# Patient Record
Sex: Female | Born: 1985 | Race: Black or African American | Hispanic: No | Marital: Single | Smoking: Current every day smoker
Health system: Southern US, Community
[De-identification: ages and names within clinical notes are randomized; demographics above are authoritative.]

---

## 2012-11-12 ENCOUNTER — Encounter (HOSPITAL_COMMUNITY): Payer: Self-pay | Admitting: Emergency Medicine

## 2012-11-12 ENCOUNTER — Emergency Department (HOSPITAL_COMMUNITY)
Admission: EM | Admit: 2012-11-12 | Discharge: 2012-11-12 | Disposition: A | Discharge status: Home / Self Care | Payer: Self-pay | Attending: Emergency Medicine | Admitting: Emergency Medicine

## 2012-11-12 DIAGNOSIS — N898 Other specified noninflammatory disorders of vagina: Secondary | ICD-10-CM | POA: Insufficient documentation

## 2012-11-12 DIAGNOSIS — F17200 Nicotine dependence, unspecified, uncomplicated: Secondary | ICD-10-CM | POA: Insufficient documentation

## 2012-11-12 DIAGNOSIS — Z3202 Encounter for pregnancy test, result negative: Secondary | ICD-10-CM | POA: Insufficient documentation

## 2012-11-12 DIAGNOSIS — Z202 Contact with and (suspected) exposure to infections with a predominantly sexual mode of transmission: Secondary | ICD-10-CM

## 2012-11-12 DIAGNOSIS — Z20828 Contact with and (suspected) exposure to other viral communicable diseases: Secondary | ICD-10-CM | POA: Insufficient documentation

## 2012-11-12 LAB — URINE MICROSCOPIC-ADD ON

## 2012-11-12 LAB — URINALYSIS, ROUTINE W REFLEX MICROSCOPIC
Bilirubin Urine: NEGATIVE
Glucose, UA: NEGATIVE mg/dL
Hgb urine dipstick: NEGATIVE
Ketones, ur: NEGATIVE mg/dL
Nitrite: NEGATIVE
Protein, ur: NEGATIVE mg/dL
Specific Gravity, Urine: 1.019 (ref 1.005–1.030)
pH: 6.5 (ref 5.0–8.0)

## 2012-11-12 LAB — WET PREP, GENITAL
Clue Cells Wet Prep HPF POC: NONE SEEN
Trich, Wet Prep: NONE SEEN
Yeast Wet Prep HPF POC: NONE SEEN

## 2012-11-12 MED ORDER — FLUCONAZOLE 100 MG PO TABS
100.0000 mg | ORAL_TABLET | Freq: Once | ORAL | Status: AC
  Administered 2012-11-12: 100 mg via ORAL
  Filled 2012-11-12: qty 1

## 2012-11-12 MED ORDER — CEFTRIAXONE SODIUM 250 MG IJ SOLR
250.0000 mg | Freq: Once | INTRAMUSCULAR | Status: AC
  Administered 2012-11-12: 250 mg via INTRAMUSCULAR
  Filled 2012-11-12: qty 250

## 2012-11-12 MED ORDER — AZITHROMYCIN 250 MG PO TABS
1000.0000 mg | ORAL_TABLET | Freq: Once | ORAL | Status: AC
  Administered 2012-11-12: 1000 mg via ORAL
  Filled 2012-11-12: qty 4

## 2012-11-12 NOTE — ED Notes (Signed)
Pt reports pelvic pain, vaginal discharge with foul smell.

## 2012-11-12 NOTE — ED Provider Notes (Signed)
CSN: [id number]     Arrival date & time 11/12/12  1147 History   First MD Initiated Contact with Patient 11/12/12 1153     Chief Complaint  Patient presents with  . Pelvic Pain   (Consider location/radiation/quality/duration/timing/severity/associated sxs/prior Treatment) HPI Comments: Patient presents emergency department with chief complaint of vaginal discharge and pelvic pain. She states the symptoms have been present for the past 2 days. Her sexual partner was recently seen and treated for STD. Patient denies fevers, chills, nausea, vomiting, or diarrhea. Its that her last menstrual period was about a month ago. She states that she is due to be coming onto her period now. Since the pain is mild. She also endorses some associated dysuria.   The history is provided by the patient. No language interpreter was used.    History reviewed. No pertinent past medical history. History reviewed. No pertinent past surgical history. No family history on file. History  Substance Use Topics  . Smoking status: Current Every Day Smoker    Types: Cigarettes  . Smokeless tobacco: Not on file  . Alcohol Use: Yes   OB History   Grav Para Term Preterm Abortions TAB SAB Ect Mult Living                 Review of Systems  All other systems reviewed and are negative.    Allergies  Aspirin  Home Medications  No current outpatient prescriptions on file. BP 149/98  Pulse 71  Temp(Src) 97.4 F (36.3 C) (Oral)  Resp 16  Wt 204 lb (92.534 kg)  SpO2 100%  LMP 10/15/2012 Physical Exam  Nursing note and vitals reviewed. Constitutional: She is oriented to person, place, and time. She appears well-developed and well-nourished.  HENT:  Head: Normocephalic and atraumatic.  Eyes: Conjunctivae and EOM are normal. Pupils are equal, round, and reactive to light.  Neck: Normal range of motion. Neck supple.  Cardiovascular: Normal rate and regular rhythm.  Exam reveals no gallop and no friction rub.    No murmur heard. Pulmonary/Chest: Effort normal and breath sounds normal. No respiratory distress. She has no wheezes. She has no rales. She exhibits no tenderness.  Abdominal: Soft. She exhibits no distension and no mass. There is tenderness. There is no rebound and no guarding. Hernia confirmed negative in the right inguinal area and confirmed negative in the left inguinal area.  Suprapubic tenderness, no other focal tenderness  Genitourinary: Bleeding: chaperone present during exam. No labial fusion. There is no rash, tenderness, lesion or injury on the right labia. There is no rash, tenderness, lesion or injury on the left labia. Uterus is tender. Uterus is not deviated, not enlarged and not fixed. Cervix exhibits discharge. Cervix exhibits no motion tenderness and no friability. Right adnexum displays no mass, no tenderness and no fullness. Left adnexum displays no mass, no tenderness and no fullness. No erythema, tenderness or bleeding around the vagina. No foreign body around the vagina. No signs of injury around the vagina. Vaginal discharge found.  Chaperone present for exam   Musculoskeletal: Normal range of motion. She exhibits no edema and no tenderness.  Lymphadenopathy:       Right: No inguinal adenopathy present.       Left: No inguinal adenopathy present.  Neurological: She is alert and oriented to person, place, and time.  Skin: Skin is warm and dry.  Psychiatric: She has a normal mood and affect. Her behavior is normal. Judgment and thought content normal.    ED  Course  Procedures (including critical care time) Results for orders placed during the hospital encounter of 11/12/12  WET PREP, GENITAL      Result Value Range   Yeast Wet Prep HPF POC NONE SEEN  NONE SEEN   Trich, Wet Prep NONE SEEN  NONE SEEN   Clue Cells Wet Prep HPF POC NONE SEEN  NONE SEEN   WBC, Wet Prep HPF POC NONE SEEN  NONE SEEN  URINALYSIS, ROUTINE W REFLEX MICROSCOPIC      Result Value Range    Color, Urine YELLOW  YELLOW   APPearance CLEAR  CLEAR   Specific Gravity, Urine 1.019  1.005 - 1.030   pH 6.5  5.0 - 8.0   Glucose, UA NEGATIVE  NEGATIVE mg/dL   Hgb urine dipstick NEGATIVE  NEGATIVE   Bilirubin Urine NEGATIVE  NEGATIVE   Ketones, ur NEGATIVE  NEGATIVE mg/dL   Protein, ur NEGATIVE  NEGATIVE mg/dL   Urobilinogen, UA 0.2  0.0 - 1.0 mg/dL   Nitrite NEGATIVE  NEGATIVE   Leukocytes, UA SMALL (*) NEGATIVE  URINE MICROSCOPIC-ADD ON      Result Value Range   Squamous Epithelial / LPF FEW (*) RARE   WBC, UA 3-6  <3 WBC/hpf  POCT PREGNANCY, URINE      Result Value Range   Preg Test, Ur NEGATIVE  NEGATIVE   No results found.   EKG Interpretation   None       MDM   1. Exposure to STD     Patient STD exposure. Reports vaginal discharge, and some suprapubic tenderness. Also complains dysuria. Will check urinalysis, and pelvic exam.  The patient's sexual partner was treated yesterday for penile discharge. I will treat the patient with azithromycin and Rocephin. Doubt PID, no cervical motion tenderness, patient is afebrile, and well-appearing. Patient discussed with Dr. Denton Lank who agrees with the plan. Discharged to home.    Roxy Horseman, PA-C 11/12/12 1447

## 2012-11-14 LAB — GC/CHLAMYDIA PROBE AMP
CT Probe RNA: NEGATIVE
GC Probe RNA: POSITIVE — AB

## 2012-11-15 NOTE — ED Notes (Signed)
+  gonorrhea Patient treated with Rocephin And Zithromax-[facility] faxed 

## 2012-11-16 NOTE — ED Provider Notes (Signed)
Medical screening examination/treatment/procedure(s) were conducted as a shared visit with non-physician practitioner(s) and myself.  I personally evaluated the patient during the encounter.  EKG Interpretation   None       Pt notes vaginal d/c. ?possible exposure to std. No fever. abd soft nt.   Suzi Roots, MD 11/16/12 1524

## 2012-11-19 ENCOUNTER — Telehealth (HOSPITAL_COMMUNITY): Payer: Self-pay | Admitting: *Deleted

## 2012-11-19 NOTE — ED Notes (Signed)
Patient informed of positive results and requests that rx be called to [facility] on [address]. rx called to pharmacy by [facility].

## 2013-04-01 ENCOUNTER — Encounter (HOSPITAL_COMMUNITY): Payer: Self-pay | Admitting: Emergency Medicine

## 2013-04-01 ENCOUNTER — Emergency Department (HOSPITAL_COMMUNITY)
Admission: EM | Admit: 2013-04-01 | Discharge: 2013-04-01 | Disposition: A | Discharge status: Home / Self Care | Payer: Self-pay | Attending: Emergency Medicine | Admitting: Emergency Medicine

## 2013-04-01 DIAGNOSIS — F17200 Nicotine dependence, unspecified, uncomplicated: Secondary | ICD-10-CM | POA: Insufficient documentation

## 2013-04-01 DIAGNOSIS — N390 Urinary tract infection, site not specified: Secondary | ICD-10-CM | POA: Insufficient documentation

## 2013-04-01 DIAGNOSIS — Z3202 Encounter for pregnancy test, result negative: Secondary | ICD-10-CM | POA: Insufficient documentation

## 2013-04-01 LAB — URINALYSIS, ROUTINE W REFLEX MICROSCOPIC
Bilirubin Urine: NEGATIVE
GLUCOSE, UA: NEGATIVE mg/dL
HGB URINE DIPSTICK: NEGATIVE
KETONES UR: NEGATIVE mg/dL
Nitrite: POSITIVE — AB
PROTEIN: NEGATIVE mg/dL
Specific Gravity, Urine: 1.017 (ref 1.005–1.030)
Urobilinogen, UA: 0.2 mg/dL (ref 0.0–1.0)
pH: 6 (ref 5.0–8.0)

## 2013-04-01 LAB — PREGNANCY, URINE: Preg Test, Ur: NEGATIVE

## 2013-04-01 LAB — HIV ANTIBODY (ROUTINE TESTING W REFLEX): HIV: NONREACTIVE

## 2013-04-01 LAB — WET PREP, GENITAL
CLUE CELLS WET PREP: NONE SEEN
Trich, Wet Prep: NONE SEEN
Yeast Wet Prep HPF POC: NONE SEEN

## 2013-04-01 LAB — URINE MICROSCOPIC-ADD ON

## 2013-04-01 MED ORDER — SULFAMETHOXAZOLE-TRIMETHOPRIM 800-160 MG PO TABS: 1.0000 | ORAL_TABLET | Freq: Two times a day (BID) | ORAL | Status: AC

## 2013-04-01 NOTE — ED Notes (Signed)
Pt presents to department for evaluation of lower abdominal pain and nausea. Ongoing x1 week. 7/10 pain at the time. Also states urinary frequency. Pt is alert and oriented x4.

## 2013-04-01 NOTE — ED Provider Notes (Addendum)
CSN: [id number]     Arrival date & time 04/01/13  4098 History   First MD Initiated Contact with Patient 04/01/13 1005     Chief Complaint  Patient presents with  . Abdominal Pain  . Nausea    Consider location/radiation/quality/duration/timing/severity/associated sxs/prior Treatment) Patient is a 28 y.o. female presenting with abdominal pain. The history is provided by the patient. No language interpreter was used.  Abdominal Pain Pain location:  Suprapubic Pain quality: aching and pressure   Pain radiates to:  Does not radiate Duration:  1 week Associated symptoms: nausea   Associated symptoms: no chills, no diarrhea, no dysuria, no fatigue, no fever, no hematuria, no shortness of breath, no vaginal bleeding, no vaginal discharge and no vomiting    patient is a 28 year old female who presents with lower abdominal pressure, abdominal pain and nausea for one week. She denies dysuria or hematuria but reports frequency. She denies fever, chills, vomiting or diarrhea. No recent illness or sick exposure. She denies back pain. She reports that she has had some recent constipation. She says that she did a pregnancy test at home a couple of days ago and it was negative that she wishes to be retested. She reports that her LMP was 02/26/2013. She reports that she has a period every month, and it comes every 28-30 days. No new sex partners, vaginal discharge or vaginal bleeding. She is not currently using any form of birth control.  History reviewed. No pertinent past medical history. History reviewed. No pertinent past surgical history. No family history on file. History  Substance Use Topics  . Smoking status: Current Every Day Smoker    Types: Cigarettes  . Smokeless tobacco: Not on file  . Alcohol Use: Yes   OB History   Grav Para Term Preterm Abortions TAB SAB Ect Mult Living                 Review of Systems  Constitutional: Negative for fever, chills and fatigue.  Respiratory:  Negative for shortness of breath.   Gastrointestinal: Positive for nausea. Negative for vomiting, abdominal pain and diarrhea.  Genitourinary: Positive for frequency and pelvic pain. Negative for dysuria, hematuria, vaginal bleeding and vaginal discharge.  Musculoskeletal: Negative for back pain, gait problem, neck pain and neck stiffness.  Neurological: Negative for dizziness and weakness.  All other systems reviewed and are negative.      Allergies  Aspirin  Home Medications  No current outpatient prescriptions on file. BP 139/77  Pulse 70  Temp(Src) 98 F (36.7 C) (Oral)  Resp 18  SpO2 100% Physical Exam  Nursing note and vitals reviewed. Constitutional: She is oriented to person, place, and time. She appears well-developed and well-nourished. No distress.  Well appearing  HENT:  Head: Normocephalic and atraumatic.  Eyes: Conjunctivae and EOM are normal.  Neck: Normal range of motion. Neck supple. No JVD present. No tracheal deviation present. No thyromegaly present.  Cardiovascular: Normal rate, regular rhythm and normal heart sounds.   Pulmonary/Chest: Effort normal and breath sounds normal.  Abdominal: Soft. Bowel sounds are normal. There is tenderness in the suprapubic area. There is no rigidity, no rebound, no guarding and no tenderness at McBurney's point.  Genitourinary: Uterus normal. There is no rash, tenderness, lesion or injury on the right labia. There is no rash, tenderness, lesion or injury on the left labia. Cervix exhibits discharge. Cervix exhibits no motion tenderness and no friability. Right adnexum displays no tenderness and no fullness. Left adnexum displays no  tenderness and no fullness. No signs of injury around the vagina. Vaginal discharge found.  Thick whitish discharge  Musculoskeletal: Normal range of motion.  Lymphadenopathy:    She has no cervical adenopathy.  Neurological: She is alert and oriented to person, place, and time.  Skin: Skin is  warm and dry. No rash noted.  Psychiatric: She has a normal mood and affect. Her behavior is normal. Judgment and thought content normal.    ED Course  Procedures (including critical care time) Labs Review Labs Reviewed - No data to display Imaging Review No results found.   EKG Interpretation None      MDM   Final diagnoses:  UTI (lower urinary tract infection)   Lower abdominal pressure, over the area of her bladder. No CMT or adnexal fullness on exam. Wet prep negative. Urine preg, negative. Urinalysis; +UTI, moderate leukocytes and nitrite positive. Plan discussed with pt and she agrees. Will call if culture results are positive. Prescription for Bactrim DS given. Return precautions discussed.      Irish EldersKelly Darnell Jeschke, NP 04/01/13 1528  Irish EldersKelly Faigy Stretch, NP 04/03/13 1235

## 2013-04-01 NOTE — Discharge Instructions (Signed)
Urinary Tract Infection Urinary tract infections (UTIs) can develop anywhere along your urinary tract. Your urinary tract is your body's drainage system for removing wastes and extra water. Your urinary tract includes two kidneys, two ureters, a bladder, and a urethra. Your kidneys are a pair of bean-shaped organs. Each kidney is about the size of your fist. They are located below your ribs, one on each side of your spine. CAUSES Infections are caused by microbes, which are microscopic organisms, including fungi, viruses, and bacteria. These organisms are so small that they can only be seen through a microscope. Bacteria are the microbes that most commonly cause UTIs. SYMPTOMS  Symptoms of UTIs may vary by age and gender of the patient and by the location of the infection. Symptoms in young women typically include a frequent and intense urge to urinate and a painful, burning feeling in the bladder or urethra during urination. Older women and men are more likely to be tired, shaky, and weak and have muscle aches and abdominal pain. A fever may mean the infection is in your kidneys. Other symptoms of a kidney infection include pain in your back or sides below the ribs, nausea, and vomiting. DIAGNOSIS To diagnose a UTI, your caregiver will ask you about your symptoms. Your caregiver also will ask to provide a urine sample. The urine sample will be tested for bacteria and white blood cells. White blood cells are made by your body to help fight infection. TREATMENT  Typically, UTIs can be treated with medication. Because most UTIs are caused by a bacterial infection, they usually can be treated with the use of antibiotics. The choice of antibiotic and length of treatment depend on your symptoms and the type of bacteria causing your infection. HOME CARE INSTRUCTIONS  If you were prescribed antibiotics, take them exactly as your caregiver instructs you. Finish the medication even if you feel better after you  have only taken some of the medication.  Drink enough water and fluids to keep your urine clear or pale yellow.  Avoid caffeine, tea, and carbonated beverages. They tend to irritate your bladder.  Empty your bladder often. Avoid holding urine for long periods of time.  Empty your bladder before and after sexual intercourse.  After a bowel movement, women should cleanse from front to back. Use each tissue only once. SEEK MEDICAL CARE IF:   You have back pain.  You develop a fever.  Your symptoms do not begin to resolve within 3 days. SEEK IMMEDIATE MEDICAL CARE IF:   You have severe back pain or lower abdominal pain.  You develop chills.  You have nausea or vomiting.  You have continued burning or discomfort with urination. MAKE SURE YOU:   Understand these instructions.  Will watch your condition.  Will get help right away if you are not doing well or get worse. Document Released: 10/07/2004 Document Revised: 06/29/2011 Document Reviewed: 02/05/2011 [facility] Patient Information 2014 [employer], [place].   Drink a lot of oral fluids Take antibiotic as directed Urinate before and after intercourse

## 2013-04-02 LAB — GC/CHLAMYDIA PROBE AMP
CT Probe RNA: NEGATIVE
GC PROBE AMP APTIMA: NEGATIVE

## 2013-04-02 NOTE — ED Provider Notes (Signed)
Medical screening examination/treatment/procedure(s) were performed by non-physician practitioner and as supervising physician I was immediately available for consultation/collaboration.  Hurman HornJohn M Connelly [place], MD 04/02/13 2325

## 2013-06-05 ENCOUNTER — Encounter (HOSPITAL_COMMUNITY): Payer: Self-pay | Admitting: Emergency Medicine

## 2013-06-05 ENCOUNTER — Emergency Department (HOSPITAL_COMMUNITY): Payer: No Typology Code available for payment source

## 2013-06-05 ENCOUNTER — Emergency Department (HOSPITAL_COMMUNITY)
Admission: EM | Admit: 2013-06-05 | Discharge: 2013-06-05 | Disposition: A | Discharge status: Home / Self Care | Payer: No Typology Code available for payment source | Attending: Emergency Medicine | Admitting: Emergency Medicine

## 2013-06-05 DIAGNOSIS — IMO0002 Reserved for concepts with insufficient information to code with codable children: Secondary | ICD-10-CM | POA: Insufficient documentation

## 2013-06-05 DIAGNOSIS — Y9389 Activity, other specified: Secondary | ICD-10-CM | POA: Insufficient documentation

## 2013-06-05 DIAGNOSIS — F17200 Nicotine dependence, unspecified, uncomplicated: Secondary | ICD-10-CM | POA: Insufficient documentation

## 2013-06-05 DIAGNOSIS — Y92410 Unspecified street and highway as the place of occurrence of the external cause: Secondary | ICD-10-CM | POA: Insufficient documentation

## 2013-06-05 DIAGNOSIS — M549 Dorsalgia, unspecified: Secondary | ICD-10-CM

## 2013-06-05 MED ORDER — NAPROXEN 500 MG PO TABS: 500.0000 mg | ORAL_TABLET | Freq: Two times a day (BID) | ORAL | Status: AC

## 2013-06-05 MED ORDER — OXYCODONE-ACETAMINOPHEN 5-325 MG PO TABS
1.0000 | ORAL_TABLET | Freq: Once | ORAL | Status: AC
  Administered 2013-06-05: 1 via ORAL
  Filled 2013-06-05: qty 1

## 2013-06-05 MED ORDER — CYCLOBENZAPRINE HCL 5 MG PO TABS: 5.0000 mg | ORAL_TABLET | Freq: Three times a day (TID) | ORAL | Status: AC | PRN
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
## 2013-06-05 NOTE — Discharge Instructions (Signed)
Take naprosyn for pain - twice daily with food  Take flexeril - muscle relaxer - Please be careful with this medication.  It can cause drowsiness.  Use caution while driving, operating machinery, drinking alcohol, or any other activities that may impair your physical or mental abilities.   Return to the emergency department if you develop any changing/worsening condition, loss of bowel/bladder function, weakness, loss of sensation, fever or any other concerns (please read additional information regarding your condition below)   Back Pain, Adult Low back pain is very common. About 1 in 5 people have back pain.The cause of low back pain is rarely dangerous. The pain often gets better over time.About half of people with a sudden onset of back pain feel better in just 2 weeks. About 8 in 10 people feel better by 6 weeks.  CAUSES Some common causes of back pain include:  Strain of the muscles or ligaments supporting the spine.  Wear and tear (degeneration) of the spinal discs.  Arthritis.  Direct injury to the back. DIAGNOSIS Most of the time, the direct cause of low back pain is not known.However, back pain can be treated effectively even when the exact cause of the pain is unknown.Answering your caregiver's questions about your overall health and symptoms is one of the most accurate ways to make sure the cause of your pain is not dangerous. If your caregiver needs more information, he or she may order lab work or imaging tests (X-rays or MRIs).However, even if imaging tests show changes in your back, this usually does not require surgery. HOME CARE INSTRUCTIONS For many people, back pain returns.Since low back pain is rarely dangerous, it is often a condition that people can learn to [facility] their own.   Remain active. It is stressful on the back to sit or stand in one place. Do not sit, drive, or stand in one place for more than 30 minutes at a time. Take short walks on level surfaces as  soon as pain allows.Try to increase the length of time you walk each day.  Do not stay in bed.Resting more than 1 or 2 days can delay your recovery.  Do not avoid exercise or work.Your body is made to move.It is not dangerous to be active, even though your back may hurt.Your back will likely heal faster if you return to being active before your pain is gone.  Pay attention to your body when you bend and lift. Many people have less discomfortwhen lifting if they bend their knees, keep the load close to their bodies,and avoid twisting. Often, the most comfortable positions are those that put less stress on your recovering back.  Find a comfortable position to sleep. Use a firm mattress and lie on your side with your knees slightly bent. If you lie on your back, put a pillow under your knees.  Only take over-the-counter or prescription medicines as directed by your caregiver. Over-the-counter medicines to reduce pain and inflammation are often the most helpful.Your caregiver may prescribe muscle relaxant drugs.These medicines help dull your pain so you can more quickly return to your normal activities and healthy exercise.  Put ice on the injured area.  Put ice in a plastic bag.  Place a towel between your skin and the bag.  Leave the ice on for 15-20 minutes, 03-04 times a day for the first 2 to 3 days. After that, ice and heat may be alternated to reduce pain and spasms.  Ask your caregiver about trying back  exercises and gentle massage. This may be of some benefit.  Avoid feeling anxious or stressed.Stress increases muscle tension and can worsen back pain.It is important to recognize when you are anxious or stressed and learn ways to manage it.Exercise is a great option. SEEK MEDICAL CARE IF:  You have pain that is not relieved with rest or medicine.  You have pain that does not improve in 1 week.  You have new symptoms.  You are generally not feeling well. SEEK IMMEDIATE  MEDICAL CARE IF:   You have pain that radiates from your back into your legs.  You develop new bowel or bladder control problems.  You have unusual weakness or numbness in your arms or legs.  You develop nausea or vomiting.  You develop abdominal pain.  You feel faint. Document Released: 12/28/2004 Document Revised: 06/29/2011 Document Reviewed: 05/18/2010 [facility] Patient Information 2014 [place], [place].   Emergency Department Resource Guide 1) Find a Doctor and Pay Out of Pocket Although you won't have to find out who is covered by your insurance plan, it is a good idea to ask around and get recommendations. You will then need to call the office and see if the doctor you have chosen will accept you as a new patient and what types of options they offer for patients who are self-pay. Some doctors offer discounts or will set up payment plans for their patients who do not have insurance, but you will need to ask so you aren't surprised when you get to your appointment.  2) Contact Your Local Health Department Not all health departments have doctors that can see patients for sick visits, but many do, so it is worth a call to see if yours does. If you don't know where your local health department is, you can check in your phone book. The CDC also has a tool to help you locate your state's health department, and many state websites also have listings of all of their local health departments.  3) Find a Walk-in Clinic If your illness is not likely to be very severe or complicated, you may want to try a walk in clinic. These are popping up all over the country in pharmacies, drugstores, and shopping centers. They're usually staffed by nurse practitioners or physician assistants that have been trained to treat common illnesses and complaints. They're usually fairly quick and inexpensive. However, if you have serious medical issues or chronic medical problems, these are probably not your best  option.  No Primary Care Doctor: - Call Health Connect at  [phone] - they can help you locate a primary care doctor that  accepts your insurance, provides certain services, etc. - Physician Referral Service- [phone]  Chronic Pain Problems: Organization         Address  Phone   Notes  [facility]  [phone] Patients need to be referred by their primary care doctor.   Medication Assistance: Organization         Address  Phone   Notes  [facility] [address] [place], [place] [phone] --Must be a resident of [facility] -- Must have NO insurance coverage whatsoever (no Medicaid/ Medicare, etc.) -- The pt. MUST have a primary care doctor that directs their care regularly and follows them in the community   MedAssist  [phone]   [employer]  [phone]    Agencies that provide inexpensive medical care: Organization  Address  Phone   Notes  [facility]  [phone]   [facility]    [phone]   [facility] [place], [place] [address] [phone]   [place] [address] [place] [place] [phone]   Planned Parenthood    [phone]   [facility]    [phone]   [place] and [place] [address], [place] Phone:  [phone], Fax:  [phone] Hours of Operation:  9 am - 6 pm, M-F.  Also accepts Medicaid/Medicare and self-pay.  [facility] for [place], [address], [place] Phone: [phone], Fax: [phone]. Hours of Operation:  8:30 am - 5:30 pm, M-F.  Also accepts Medicaid and self-pay.  [facility] [place] [address] [place] Phone: [phone]   [place], [place], [place] [phone], Ext. [phone] Mondays & Thursdays: 7-9 AM.  First 15  patients are seen on a first come, first serve basis.    [place] Providers:  Organization         Address  Phone   Notes  [facility] [address], [place] [phone] Also accepts self-pay patients.  [facility] [id number] [place], [place]  [phone]   [place], [address], [place] [phone]   [facility] [address], [place] [phone]   Lucianne Lei [address], [place]   [phone] Only accepts [place] Access [place] patients after they have their name applied to their card.   Self-Pay (no insurance) in [facility]:  Organization         Address  Phone   Notes  Sickle Cell Patients, [facility] [place] [phone]   [facility] [place] [phone]   [facility] [place]  [place], [place],  [phone]   [facility]/Dr. Osei-Bonsu  [address], [place] or [address], [place] [phone] Phone number for both [place] and [place] locations is the same.  Urgent Medical and [facility] [address], [place] [phone]   [facility] [address], [place] or [address] [phone]   [facility] [place] [address], [place] [phone], phone; [phone], fax Sees patients 1st and 3rd Saturday of every month.  Must not qualify for public or private insurance (i.e. Medicaid, Medicare, [place] Health Choice, Veterans' Benefits)  Household income should be no more than 200% of the poverty level The clinic cannot treat you if you are pregnant or think you are pregnant  Sexually transmitted diseases are not treated at the clinic.    Dental  Care: Organization         Address  Phone  Notes  [facility] of [facility] [phone] Accepts children up to age 35 who are enrolled in [place] or [place]; pregnant women with a Medicaid card; and children who have applied for Medicaid or  Health Choice, but were declined, whose parents can pay a reduced fee at time  of service.  [facility] Department of [facility]  [address], [place] [phone] Accepts children up to age 46 who are enrolled in [place] or [place]; pregnant women with a Medicaid card; and children who have applied for Medicaid or [place] Health Choice, but were declined, whose parents can pay a reduced fee at time of service.  [place] Adult Dental Access PROGRAM  [place] [phone] Patients are seen by appointment only. Walk-ins are not accepted. [place] will see patients 50 years of age and older. Monday - Tuesday (8am-5pm) Most Wednesdays (8:30-5pm) $30 per visit, cash only  [facility] Adult Dental Access PROGRAM  [address], [facility] [phone] Patients are seen by appointment only. Walk-ins are not accepted. [place] will see patients 64 years of age and older. One Wednesday Evening (Monthly: Volunteer Based).  $30 per visit, cash only  [place]  [phone] for adults; Children under age 18, call Graduate Pediatric Dentistry at [phone]. Children aged 70-14, please call [phone] to request a pediatric application.  Dental services are provided in all areas of dental care including fillings, crowns and bridges, complete and partial dentures, implants, gum treatment, root canals, and extractions. Preventive care is also provided. Treatment is provided to both adults and children. Patients are selected via a lottery and there is often a waiting list.   [facility] [address], [place]  [phone] [email or web address]   [facility] [address] [place], [place] [phone], Ext. [phone] Second and Fourth Thursday of each month, opens at 6:30 AM; Clinic ends at 9 AM.  Patients are seen on a first-come first-served basis, and a limited number are seen during each clinic.   [facility]  [address] Hillard Danker [place], [place] [phone]   Eligibility Requirements You must have lived in [place], [place], or [place] counties for at least the last three months.   You cannot be eligible for state or federal sponsored [employer], including [employer], [place], or [employer].   You generally cannot be eligible for healthcare insurance through your employer.    How to apply: Eligibility screenings are held every Tuesday and Wednesday afternoon from 1:00 pm until 4:00 pm. You do not need an appointment for the interview!  [facility] [address], [place], [place]   [place]  [facility] Department  [place]  [phone]    Behavioral Health Resources in the Community: Intensive Outpatient Programs Organization         Address  Phone  Notes  [place]. [address] [place], [place] [phone]   [facility] Outpatient [address], [place], [place]   ADS: Alcohol & Drug Svcs [address], [place], [place]   [place] [address]  [place], [place] or [phone]   Substance Abuse Resources Organization         Address  Phone  Notes  Alcohol and Drug Services  [phone]   [place]  [phone]   [facility] [place]   Chinita Pester  [phone]   Residential & Outpatient Substance Abuse Program  [phone]    Psychological Services Organization         Address  Phone  Notes  [employer] [facility]  [phone]   [facility] Services  [phone]   [facility] Mental [facility] [address], [place] [phone] or [phone]    Mobile Crisis Teams Organization         Address  Phone  Notes  [facility], Mobile Crisis Care Unit  [phone]   [facility]  [address] [place], [place] [phone]   Doristine Locks [address] [place] [phone]    Self-Help/Support Groups Organization         Address  Phone             Notes  [facility]ental Health Assoc. of [place] - variety of support groups  [phone]- I7437963 Call for more information  Narcotics Anonymous (NA), Caring Services [address], [employer] [place]  2 meetings at this location   Statistician         Address  Phone  Notes  [facility] [address],    [place]  [phone]   [facility]  [address], [place] [address], [place], [place] [phone]   [facility] [address] [place], [place] [phone] Admissions: 8am-3pm M-F  [facility] [address]    [place], [place] [phone]   [facility] [address] [place], [place], [place] [phone]   [facility] [address],  [place], [place] [phone]   [facility] - Intensive Outpatient [address]ure[address], [place], [place] [phone]   [facility] (Addiction Recovery Care [facility] [address] [place]  [place], [place] [phone] or [phone]   [facility] Services (RTS) [address], [place], [place] [phone] Accepts Medicaid  [facility] [place] [address],  [place] [phone] Substance Abuse/Addiction Treatment   [facility] Organization         Address  Phone  Notes  [facility]  [phone]   Angie Fava, PhD [address] Ervin Knack [place], [place]   [phone] or [phone]   [facility]   [address] [place], [place] [phone]   [facility] [address] [place], [place] [phone] Insurance/Medicaid/sponsorship through [facility] and Families [address]                                    [place], [place] [phone] Therapy/tele-psych/case  [facility] [address], [place] [phone]    Dr. Lolly Mustache  [phone]   [facility] [place]  United [employer] [facility] Dept. 1) [address] [place] 2) [address] [place] 3)  [address], [place] [phone]  [phone]   [facility] Child Abuse Hotline 639-338-9850 or 417-344-1500 (After Hours)
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
## 2013-06-07 NOTE — ED Provider Notes (Signed)
Medical screening examination/treatment/procedure(s) were performed by non-physician practitioner and as supervising physician I was immediately available for consultation/collaboration.  Deaglan Lile J. Vienne Corcoran, MD 06/07/13 1650 

## 2013-11-18 ENCOUNTER — Encounter (HOSPITAL_COMMUNITY): Payer: Self-pay | Admitting: Nurse Practitioner

## 2013-11-18 ENCOUNTER — Emergency Department (HOSPITAL_COMMUNITY)
Admission: EM | Admit: 2013-11-18 | Discharge: 2013-11-18 | Disposition: A | Discharge status: Home / Self Care | Payer: No Typology Code available for payment source | Attending: Emergency Medicine | Admitting: Emergency Medicine

## 2013-11-18 DIAGNOSIS — R300 Dysuria: Secondary | ICD-10-CM

## 2013-11-18 DIAGNOSIS — N949 Unspecified condition associated with female genital organs and menstrual cycle: Secondary | ICD-10-CM

## 2013-11-18 DIAGNOSIS — Z791 Long term (current) use of non-steroidal anti-inflammatories (NSAID): Secondary | ICD-10-CM | POA: Insufficient documentation

## 2013-11-18 DIAGNOSIS — N898 Other specified noninflammatory disorders of vagina: Secondary | ICD-10-CM | POA: Insufficient documentation

## 2013-11-18 DIAGNOSIS — Z720 Tobacco use: Secondary | ICD-10-CM | POA: Insufficient documentation

## 2013-11-18 DIAGNOSIS — Z3202 Encounter for pregnancy test, result negative: Secondary | ICD-10-CM | POA: Insufficient documentation

## 2013-11-18 LAB — URINALYSIS, ROUTINE W REFLEX MICROSCOPIC
BILIRUBIN URINE: NEGATIVE
Glucose, UA: NEGATIVE mg/dL
Ketones, ur: NEGATIVE mg/dL
NITRITE: NEGATIVE
PH: 7 (ref 5.0–8.0)
Protein, ur: NEGATIVE mg/dL
Specific Gravity, Urine: 1.012 (ref 1.005–1.030)
Urobilinogen, UA: 0.2 mg/dL (ref 0.0–1.0)

## 2013-11-18 LAB — POC URINE PREG, ED: Preg Test, Ur: NEGATIVE

## 2013-11-18 LAB — URINE MICROSCOPIC-ADD ON

## 2013-11-18 LAB — PREGNANCY, URINE: Preg Test, Ur: NEGATIVE

## 2013-11-18 LAB — WET PREP, GENITAL
Clue Cells Wet Prep HPF POC: NONE SEEN
Trich, Wet Prep: NONE SEEN
YEAST WET PREP: NONE SEEN

## 2013-11-18 MED ORDER — CEFTRIAXONE SODIUM 250 MG IJ SOLR
250.0000 mg | Freq: Once | INTRAMUSCULAR | Status: AC
  Administered 2013-11-18: 250 mg via INTRAMUSCULAR
  Filled 2013-11-18: qty 250

## 2013-11-18 MED ORDER — LIDOCAINE HCL (PF) 1 % IJ SOLN
2.0000 mL | Freq: Once | INTRAMUSCULAR | Status: AC
  Administered 2013-11-18: 2 mL

## 2013-11-18 MED ORDER — LIDOCAINE HCL (PF) 1 % IJ SOLN
INTRAMUSCULAR | Status: AC
  Filled 2013-11-18: qty 5

## 2013-11-18 MED ORDER — AZITHROMYCIN 250 MG PO TABS
1000.0000 mg | ORAL_TABLET | Freq: Once | ORAL | Status: AC
  Administered 2013-11-18: 1000 mg via ORAL
  Filled 2013-11-18: qty 4

## 2013-11-18 NOTE — ED Provider Notes (Signed)
CSN: [id number]     Arrival date & time 11/18/13  1320 History   First MD Initiated Contact with Patient 11/18/13 1331     Chief Complaint  Patient presents with  . Vaginal Itching     (Consider location/radiation/quality/duration/timing/severity/associated sxs/prior Treatment) HPI   Pt presents with abnormal sensation of her vagina.  States she initially had itching but not it "just feels weird."  Also has some burning at the end of urination and left lower back discomfort.  Had sexual intercourse with a man 2 weeks ago and the condom broke.  Also has recently changed soaps.  Used OTC monistat without improvement.  LMP Nov 13, 2013.  Denies fevers, chills, abdominal pain, abnormal vaginal discharge, urinary frequency or urgency, change in bowel habits.   History reviewed. No pertinent past medical history. History reviewed. No pertinent past surgical history. History reviewed. No pertinent family history. History  Substance Use Topics  . Smoking status: Current Every Day Smoker -- 0.50 packs/day    Types: Cigarettes  . Smokeless tobacco: Never Used  . Alcohol Use: Yes   OB History    No data available     Review of Systems  All other systems reviewed and are negative.     Allergies  Aspirin  Home Medications   Prior to Admission medications   Medication Sig Start Date End Date Taking? Authorizing Provider  cyclobenzaprine (FLEXERIL) 5 MG tablet Take 1 tablet (5 mg total) by mouth 3 (three) times daily as needed for muscle spasms. 06/05/13   Jillyn LedgerJessica K Palmer, PA-C  naproxen (NAPROSYN) 500 MG tablet Take 1 tablet (500 mg total) by mouth 2 (two) times daily with a meal. 06/05/13   Jillyn LedgerJessica K Palmer, PA-C   BP 123/89 mmHg  Pulse 62  Temp(Src) 98.2 F (36.8 C) (Oral)  Resp 16  SpO2 99%  LMP 11/13/2013 Physical Exam  Constitutional: She appears well-developed and well-nourished. No distress.  HENT:  Head: Normocephalic and atraumatic.  Neck: Neck supple.   Pulmonary/Chest: Effort normal.  Abdominal: Soft. She exhibits no distension and no mass. There is no tenderness. There is no rebound and no guarding.  Genitourinary: Uterus is not enlarged, not fixed and not tender. Cervix exhibits no motion tenderness. Right adnexum displays no mass, no tenderness and no fullness. Left adnexum displays no mass, no tenderness and no fullness. There is tenderness in the vagina. No erythema or bleeding in the vagina. No foreign body around the vagina. No signs of injury around the vagina. Vaginal discharge found.  Moderate amount of thin clear discharge with some thicker white/yellow mucous.    Neurological: She is alert.  Skin: She is not diaphoretic.  Nursing note and vitals reviewed.   ED Course  Procedures (including critical care time) Labs Review Labs Reviewed  WET PREP, GENITAL - Abnormal; Notable for the following:    WBC, Wet Prep HPF POC MODERATE (*)    All other components within normal limits  URINALYSIS, ROUTINE W REFLEX MICROSCOPIC - Abnormal; Notable for the following:    Hgb urine dipstick MODERATE (*)    Leukocytes, UA TRACE (*)    All other components within normal limits  GC/CHLAMYDIA PROBE AMP  URINE CULTURE  PREGNANCY, URINE  URINE MICROSCOPIC-ADD ON  RPR  HIV ANTIBODY (ROUTINE TESTING)  POC URINE PREG, ED    Imaging Review No results found.   EKG Interpretation None      MDM   Final diagnoses:  Vaginal discomfort  Dysuria  Afebrile, nontoxic patient with abnormal vaginal sensation, some dysuria that seems mostly external.  Abnormal vaginal discharge one exam and pt had condom break with sexual encounter prior to symptoms. UA is abnormal but suspect this might be vaginal.  However, if urine culture is positive, would treat.  Clinically no PID.  Discussed all results with patient and engaged in joint medical decision making and decided to treat empirically for GC/Chlam.  Pt aware all STD/HIV tests are pending.  She  is aware that if positive all sexual partners must be tested and treated.  D/C home with PCP/gyn follow up.  Discussed result, findings, treatment, and follow up  with patient.  Pt given return precautions.  Pt verbalizes understanding and agrees with plan.        Trixie Dredgemily Aithana Kushner, PA-C 11/18/13 1531  Suzi RootsKevin E Steinl, MD 11/23/13 [phone]

## 2013-11-18 NOTE — Discharge Instructions (Signed)
Read the information below.  You may return to the Emergency Department at any time for worsening condition or any new symptoms that concern you.  If you develop high fevers, worsening abdominal pain, uncontrolled vomiting, difficulty urinating, or are unable to tolerate fluids by mouth, return to the ER for a recheck.    Pelvic Pain Female pelvic pain can be caused by many different things and start from a variety of places. Pelvic pain refers to pain that is located in the lower half of the abdomen and between your hips. The pain may occur over a short period of time (acute) or may be reoccurring (chronic). The cause of pelvic pain may be related to disorders affecting the female reproductive organs (gynecologic), but it may also be related to the bladder, kidney stones, an intestinal complication, or muscle or skeletal problems. Getting help right away for pelvic pain is important, especially if there has been severe, sharp, or a sudden onset of unusual pain. It is also important to get help right away because some types of pelvic pain can be life threatening.  CAUSES  Below are only some of the causes of pelvic pain. The causes of pelvic pain can be in one of several categories.   Gynecologic.  Pelvic inflammatory disease.  Sexually transmitted infection.  Ovarian cyst or a twisted ovarian ligament (ovarian torsion).  Uterine lining that grows outside the uterus (endometriosis).  Fibroids, cysts, or tumors.  Ovulation.  Pregnancy.  Pregnancy that occurs outside the uterus (ectopic pregnancy).  Miscarriage.  Labor.  Abruption of the placenta or ruptured uterus.  Infection.  Uterine infection (endometritis).  Bladder infection.  Diverticulitis.  Miscarriage related to a uterine infection (septic abortion).  Bladder.  Inflammation of the bladder (cystitis).  Kidney stone(s).  Gastrointestinal.  Constipation.  Diverticulitis.  Neurologic.  Trauma.  Feeling  pelvic pain because of mental or emotional causes (psychosomatic).  Cancers of the bowel or pelvis. EVALUATION  Your caregiver will want to take a careful history of your concerns. This includes recent changes in your health, a careful gynecologic history of your periods (menses), and a sexual history. Obtaining your family history and medical history is also important. Your caregiver may suggest a pelvic exam. A pelvic exam will help identify the location and severity of the pain. It also helps in the evaluation of which organ system may be involved. In order to identify the cause of the pelvic pain and be properly treated, your caregiver may order tests. These tests may include:   A pregnancy test.  Pelvic ultrasonography.  An X-ray exam of the abdomen.  A urinalysis or evaluation of vaginal discharge.  Blood tests. HOME CARE INSTRUCTIONS   Only take over-the-counter or prescription medicines for pain, discomfort, or fever as directed by your caregiver.   Rest as directed by your caregiver.   Eat a balanced diet.   Drink enough fluids to make your urine clear or pale yellow, or as directed.   Avoid sexual intercourse if it causes pain.   Apply warm or cold compresses to the lower abdomen depending on which one helps the pain.   Avoid stressful situations.   Keep a journal of your pelvic pain. Write down when it started, where the pain is located, and if there are things that seem to be associated with the pain, such as food or your menstrual cycle.  Follow up with your caregiver as directed.  SEEK MEDICAL CARE IF:  Your medicine does not help your pain.  You have abnormal vaginal discharge. SEEK IMMEDIATE MEDICAL CARE IF:   You have heavy bleeding from the vagina.   Your pelvic pain increases.   You feel light-headed or faint.   You have chills.   You have pain with urination or blood in your urine.   You have uncontrolled diarrhea or vomiting.    You have a fever or persistent symptoms for more than 3 days.  You have a fever and your symptoms suddenly get worse.   You are being physically or sexually abused.  MAKE SURE YOU:  Understand these instructions.  Will watch your condition.  Will get help if you are not doing well or get worse. Document Released: 11/25/2003 Document Revised: 05/14/2013 Document Reviewed: 04/19/2011 [facility] Patient Information 2015 [employer] [employer], [place]. This information is not intended to replace advice given to you by your health care provider. Make sure you discuss any questions you have with your health care provider.

## 2013-11-18 NOTE — ED Notes (Signed)
She c/o burning at the end of urination, vaginal itching x 4 days. She reports recent intercourse during which the condom broke

## 2013-11-19 LAB — GC/CHLAMYDIA PROBE AMP
CT Probe RNA: NEGATIVE
GC Probe RNA: NEGATIVE

## 2013-11-19 LAB — HIV ANTIBODY (ROUTINE TESTING W REFLEX): HIV 1&2 Ab, 4th Generation: NONREACTIVE

## 2013-11-19 LAB — RPR

## 2013-11-22 ENCOUNTER — Telehealth (HOSPITAL_COMMUNITY): Payer: Self-pay

## 2013-11-22 LAB — URINE CULTURE
Colony Count: >=100000
SPECIAL REQUESTS: NORMAL

## 2013-11-22 NOTE — Progress Notes (Cosign Needed)
ED Antimicrobial Stewardship Positive Culture Follow Up   Jackie Kane is an 28 y.o. female [facility] a chief complaint of vaginal itching Chief Complaint  Patient presents with  . Vaginal Itching    Recent Results (from the past 720 hour(s))  GC/Chlamydia Probe Amp     Status: None   Collection Time: 11/18/13  2:20 PM  Result Value Ref Range Status   CT Probe RNA NEGATIVE NEGATIVE Final   GC Probe RNA NEGATIVE NEGATIVE Final    Comment: (NOTE)                                                                                       **Normal Reference Range: Negative**      Assay performed using the Gen-Probe APTIMA COMBO2 (R) Assay. Acceptable specimen types for this assay include APTIMA Swabs (Unisex, endocervical, urethral, or vaginal), first void urine, and ThinPrep liquid based cytology samples. Performed at [facility]   Wet prep, genital     Status: Abnormal   Collection Time: 11/18/13  2:20 PM  Result Value Ref Range Status   Yeast Wet Prep HPF POC NONE SEEN NONE SEEN Final   Trich, Wet Prep NONE SEEN NONE SEEN Final   Clue Cells Wet Prep HPF POC NONE SEEN NONE SEEN Final   WBC, Wet Prep HPF POC MODERATE (A) NONE SEEN Final  Urine culture     Status: None   Collection Time: 11/18/13  2:20 PM  Result Value Ref Range Status   Specimen Description URINE, CLEAN CATCH  Final   Special Requests Normal  Final   Culture  Setup Time   Final    11/18/2013 23:06 Performed at [facility]    Colony Count   Final    >=100,000 COLONIES/ML Performed at [facility]    Culture   Final    ESCHERICHIA COLI GROUP B STREP(S.AGALACTIAE)ISOLATED Note: TESTING AGAINST S. AGALACTIAE NOT ROUTINELY PERFORMED DUE TO PREDICTABILITY OF AMP/PEN/VAN SUSCEPTIBILITY. Performed at [facility]    Report Status 11/22/2013 FINAL  Final   Organism ID, Bacteria ESCHERICHIA COLI  Final      Susceptibility   Escherichia coli - MIC*     AMPICILLIN >=32 RESISTANT Resistant     CEFAZOLIN 8 SENSITIVE Sensitive     CEFTRIAXONE <=1 SENSITIVE Sensitive     CIPROFLOXACIN >=4 RESISTANT Resistant     GENTAMICIN <=1 SENSITIVE Sensitive     LEVOFLOXACIN >=8 RESISTANT Resistant     NITROFURANTOIN <=16 SENSITIVE Sensitive     TOBRAMYCIN <=1 SENSITIVE Sensitive     TRIMETH/SULFA >=320 RESISTANT Resistant     PIP/TAZO <=4 SENSITIVE Sensitive     * ESCHERICHIA COLI   [x]  Patient discharged originally without antimicrobial agent and treatment is now indicated  New antibiotic prescription: Start keflex 500mg  BID x 7d  ED Provider: Junius FinnerErin O'Malley, Jackie Kane   Jackie Kane, Jackie Kane 11/22/2013, 10:00 AM Infectious Diseases Pharmacist Phone# [phone]

## 2013-11-22 NOTE — ED Notes (Signed)
Post ED Visit - Positive Culture Follow-up: Successful Patient Follow-Up  Culture assessed and recommendations reviewed by: []  Wes Dulaney, Pharm.D., BCPS []  Celedonio MiyamotoJeremy Frens, Pharm.D., BCPS []  Georgina PillionElizabeth Martin, Pharm.D., BCPS []  BataviaMinh Pham, [address]Pharm[address]D., BCPS, AAHIVP []  Estella HuskMichelle Turner, Pharm.D., BCPS, AAHIVP []  Red ChristiansSamson Lee, Pharm.D. []  [name], [address]Pharm[address]  Positive urine culture  [x]  Patient discharged without antimicrobial prescription and treatment is now indicated []  Organism is resistant to prescribed ED discharge antimicrobial []  Patient with positive blood cultures  Changes discussed with ED provider: Teryl LucyE. O'malley New antibiotic prescription Keflex 500mg  Q12 hours x 7 days Called to [facility]  Contacted patient, date 11/22/2013, time 1118   [name], [name] 11/22/2013, 11:17 AM

## 2014-07-06 ENCOUNTER — Inpatient Hospital Stay (HOSPITAL_COMMUNITY)
Admission: AD | Admit: 2014-07-06 | Discharge: 2014-07-06 | Disposition: A | Discharge status: Home / Self Care | Payer: Self-pay | Source: Ambulatory Visit | Attending: Obstetrics & Gynecology | Admitting: Obstetrics & Gynecology

## 2014-07-06 DIAGNOSIS — Z3202 Encounter for pregnancy test, result negative: Secondary | ICD-10-CM | POA: Insufficient documentation

## 2014-07-06 DIAGNOSIS — F17210 Nicotine dependence, cigarettes, uncomplicated: Secondary | ICD-10-CM | POA: Insufficient documentation

## 2014-07-06 LAB — HCG, QUANTITATIVE, PREGNANCY: hCG, Beta Chain, Quant, S: <1 m[IU]/mL (ref <5)

## 2014-07-06 LAB — POCT PREGNANCY, URINE: PREG TEST UR: NEGATIVE

## 2014-07-06 NOTE — Discharge Instructions (Signed)
Repeat pregnancy test at home in 2 weeks if no normal period. If periods continue to be abnormal and pregnancy test is negative, follow up with outpatient gynecologist.

## 2014-07-06 NOTE — MAU Provider Note (Signed)
  History     CSN: [id number]  Arrival date and time: 07/06/14 1526   None     No chief complaint on file.  HPI 29 y.o. female here requesting pregnancy test. One positive and 2 negative tests at home, last period was late and light. Patient's last menstrual period was 06/29/2014 (exact date).  No past medical history on file.  No past surgical history on file.  No family history on file.  History  Substance Use Topics  . Smoking status: Current Every Day Smoker -- 0.50 packs/day    Types: Cigarettes  . Smokeless tobacco: Never Used  . Alcohol Use: Yes    Allergies:  Allergies  Allergen Reactions  . Aspirin Itching    No prescriptions prior to admission    Review of Systems  Constitutional: Negative.   Gastrointestinal: Negative.   Genitourinary: Negative.    Physical Exam   Blood pressure 136/84, pulse 77, temperature 98.7 F (37.1 C), temperature source Oral, resp. rate 16, height 5\' 7"  (1.702 m), weight 93.157 kg (205 lb 6 oz), last menstrual period 06/29/2014.  Physical Exam  Nursing note and vitals reviewed. Constitutional: She appears well-developed and well-nourished. No distress.  Psychiatric: She has a normal mood and affect.    MAU Course  Procedures Results for orders placed or performed during the hospital encounter of 07/06/14 (from the past 24 hour(s))  Pregnancy, urine POC     Status: None   Collection Time: 07/06/14  3:50 PM  Result Value Ref Range   Preg Test, Ur NEGATIVE NEGATIVE  hCG, quantitative, pregnancy     Status: None   Collection Time: 07/06/14  4:06 PM  Result Value Ref Range   hCG, Beta Chain, Quant, S <1 <5 mIU/mL   Quant HCG ordered d/t discrepancy with home UPT.   Assessment and Plan   1. Negative pregnancy test   Repeat at home if no normal menses in 2 weeks, f/u w/ GYN if no normal menses for 3 months    Medication List    TAKE these medications        cyclobenzaprine 5 MG tablet  Commonly known as:   FLEXERIL  Take 1 tablet (5 mg total) by mouth 3 (three) times daily as needed for muscle spasms.     naproxen 500 MG tablet  Commonly known as:  NAPROSYN  Take 1 tablet (500 mg total) by mouth 2 (two) times daily with a meal.            Follow-up Information    Follow up with [facility].   Specialty:  Obstetrics and Gynecology   Why:  As needed   Contact information:   [address] [place] [address] [phone]        Georges Mouse 07/06/2014, 5:46 PM

## 2014-07-06 NOTE — MAU Note (Addendum)
Pt states was supposed to come on cycle 6/03, and didn't come on til 06/18, was light pink. Now is still light pink. Has had one positive and two negative upt's at home. No abnormal vaginal d/c. Bleeding began after intercourse.
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
